# Patient Record
Sex: Male | Born: 1975
Health system: Southern US, Community
[De-identification: ages and names within clinical notes are randomized; demographics above are authoritative.]

## PROBLEM LIST (undated history)

## (undated) DIAGNOSIS — E7800 Pure hypercholesterolemia, unspecified: Secondary | ICD-10-CM

## (undated) HISTORY — PX: OTHER SURGICAL HISTORY: SHX169

---

## 2002-01-08 ENCOUNTER — Encounter: Payer: Self-pay | Admitting: Emergency Medicine

## 2002-01-08 ENCOUNTER — Emergency Department (HOSPITAL_COMMUNITY): Admission: EM | Admit: 2002-01-08 | Discharge: 2002-01-08 | Payer: Self-pay | Admitting: Emergency Medicine

## 2015-06-13 ENCOUNTER — Emergency Department (HOSPITAL_COMMUNITY): Payer: Self-pay

## 2015-06-13 ENCOUNTER — Emergency Department (HOSPITAL_COMMUNITY)
Admission: EM | Admit: 2015-06-13 | Discharge: 2015-06-13 | Disposition: A | Discharge status: Home / Self Care | Payer: Self-pay | Attending: Emergency Medicine | Admitting: Emergency Medicine

## 2015-06-13 ENCOUNTER — Encounter (HOSPITAL_COMMUNITY): Payer: Self-pay | Admitting: Emergency Medicine

## 2015-06-13 DIAGNOSIS — Y9364 Activity, baseball: Secondary | ICD-10-CM | POA: Insufficient documentation

## 2015-06-13 DIAGNOSIS — Y92320 Baseball field as the place of occurrence of the external cause: Secondary | ICD-10-CM | POA: Insufficient documentation

## 2015-06-13 DIAGNOSIS — S8992XA Unspecified injury of left lower leg, initial encounter: Secondary | ICD-10-CM | POA: Insufficient documentation

## 2015-06-13 DIAGNOSIS — Y998 Other external cause status: Secondary | ICD-10-CM | POA: Insufficient documentation

## 2015-06-13 DIAGNOSIS — X58XXXA Exposure to other specified factors, initial encounter: Secondary | ICD-10-CM | POA: Insufficient documentation

## 2015-06-13 MED ORDER — TRAMADOL HCL 50 MG PO TABS: 50.0000 mg | ORAL_TABLET | Freq: Four times a day (QID) | ORAL | Status: DC | PRN

## 2015-06-13 MED ORDER — HYDROCODONE-ACETAMINOPHEN 5-325 MG PO TABS
1.0000 | ORAL_TABLET | Freq: Once | ORAL | Status: AC
  Administered 2015-06-13: 1 via ORAL
  Filled 2015-06-13: qty 1

## 2015-06-13 MED ORDER — NAPROXEN 375 MG PO TABS: 375.0000 mg | ORAL_TABLET | Freq: Two times a day (BID) | ORAL | Status: DC

## 2015-06-13 NOTE — ED Provider Notes (Signed)
CSN: [id number]     Arrival date & time 06/13/15  1055 History   First MD Initiated Contact with Patient 06/13/15 1115     Chief Complaint  Patient presents with  . Knee Pain     (Consider location/radiation/quality/duration/timing/severity/associated sxs/prior Treatment) HPI   This is a 39 year old male who presents emergency Department with chief complaint of left knee injury. Patient has a past medical history of left anterior cruciate ligament repair about 20 years ago. Patient states he was playing baseball last night when his foot came down in a pothole in the ground. His foot was planted and he twisted medially reaching for a ball that he was catching. He felt his knee pop and had immediate pain but was able to ambulate off the field. Patient no longer bleeding again. Overnight he has had increasing swelling and states that this is "the worst pain of November had. He denies calf pain, foot swelling or tenderness. He is unable to fully extend the knee or ambulate at all. Patient brings his own crutches.  History reviewed. No pertinent past medical history. Past Surgical History  Procedure Laterality Date  . Knee surery     No family history on file. Social History  Substance Use Topics  . Smoking status: Never Smoker   . Smokeless tobacco: None  . Alcohol Use: Yes    Review of Systems  Constitutional: Negative for fever.  Musculoskeletal: Positive for joint swelling and gait problem.  Neurological: Negative for weakness and numbness.      Allergies  Review of patient's allergies indicates no known allergies.  Home Medications   Prior to Admission medications   Medication Sig Start Date End Date Taking? Authorizing Provider  PRESCRIPTION MEDICATION Inject into the skin daily. HCG injection for weightloss- from [facility]   Yes Historical Provider, MD   BP 123/68 mmHg  Pulse 63  Temp(Src) 98.2 F (36.8 C) (Oral)  Resp 16  SpO2 100% Physical Exam   Constitutional: He appears well-developed and well-nourished. No distress.  HENT:  Head: Normocephalic and atraumatic.  Eyes: Conjunctivae are normal. No scleral icterus.  Neck: Normal range of motion. Neck supple.  Cardiovascular: Normal rate, regular rhythm and normal heart sounds.   Pulmonary/Chest: Effort normal and breath sounds normal. No respiratory distress.  Abdominal: Soft. There is no tenderness.  Musculoskeletal: He exhibits no edema.  Left knee swelling. Patient unable to fully extend the knee due to swelling and pain. He is able to flex the knee to approximately 90. He has pain with medial stress. Ligaments appear stable. He is neurovascularly intact.  Neurological: He is alert.  Skin: Skin is warm and dry. He is not diaphoretic.  Psychiatric: His behavior is normal.  Nursing note and vitals reviewed.   ED Course  Procedures (including critical care time) Labs Review Labs Reviewed - No data to display  Imaging Review Dg Knee Complete 4 Views Left  06/13/2015  CLINICAL DATA:  Left knee pain, injury playing baseball yesterday EXAM: LEFT KNEE - COMPLETE 4+ VIEW COMPARISON:  None. FINDINGS: Four views of the left knee submitted. Postsurgical changes are noticed put ACL repair. No acute fracture or subluxation. Trace joint effusion. IMPRESSION: No acute fracture or subluxation. Postsurgical changes are noted post ACL repair. Trace joint effusion. Electronically Signed   By: Natasha MeadLiviu  Pop M.D.   On: 06/13/2015 11:53   I have personally reviewed and evaluated these images and lab results as part of my medical decision-making.   EKG Interpretation None  MDM   Final diagnoses:  None    Patient X-Ray negative for obvious fracture or dislocation. Pain managed in ED. Pt advised to follow up with orthopedics if symptoms persist for possibility of missed fracture diagnosis. Patient given brace while in ED, conservative therapy recommended and discussed. Patient will be dc  home & is agreeable with above plan.     Arthor Captain, PA-C 06/13/15 1248  Gilda Crease, MD 06/13/15 [phone]

## 2015-06-13 NOTE — ED Notes (Signed)
Per pt, states he came down on left knee wrong while playing baseball

## 2015-06-13 NOTE — Discharge Instructions (Signed)

## 2016-10-20 ENCOUNTER — Emergency Department (HOSPITAL_BASED_OUTPATIENT_CLINIC_OR_DEPARTMENT_OTHER): Payer: BLUE CROSS/BLUE SHIELD

## 2016-10-20 ENCOUNTER — Encounter (HOSPITAL_BASED_OUTPATIENT_CLINIC_OR_DEPARTMENT_OTHER): Payer: Self-pay | Admitting: *Deleted

## 2016-10-20 ENCOUNTER — Emergency Department (HOSPITAL_BASED_OUTPATIENT_CLINIC_OR_DEPARTMENT_OTHER)
Admission: EM | Admit: 2016-10-20 | Discharge: 2016-10-20 | Disposition: A | Discharge status: Home / Self Care | Payer: BLUE CROSS/BLUE SHIELD | Attending: Emergency Medicine | Admitting: Emergency Medicine

## 2016-10-20 ENCOUNTER — Emergency Department (HOSPITAL_COMMUNITY): Payer: BLUE CROSS/BLUE SHIELD

## 2016-10-20 DIAGNOSIS — Y999 Unspecified external cause status: Secondary | ICD-10-CM | POA: Diagnosis not present

## 2016-10-20 DIAGNOSIS — M50322 Other cervical disc degeneration at C5-C6 level: Secondary | ICD-10-CM | POA: Insufficient documentation

## 2016-10-20 DIAGNOSIS — Y93I9 Activity, other involving external motion: Secondary | ICD-10-CM | POA: Insufficient documentation

## 2016-10-20 DIAGNOSIS — R51 Headache: Secondary | ICD-10-CM | POA: Diagnosis not present

## 2016-10-20 DIAGNOSIS — Y92410 Unspecified street and highway as the place of occurrence of the external cause: Secondary | ICD-10-CM | POA: Insufficient documentation

## 2016-10-20 DIAGNOSIS — S199XXA Unspecified injury of neck, initial encounter: Secondary | ICD-10-CM | POA: Insufficient documentation

## 2016-10-20 DIAGNOSIS — M545 Low back pain: Secondary | ICD-10-CM | POA: Diagnosis not present

## 2016-10-20 DIAGNOSIS — S299XXA Unspecified injury of thorax, initial encounter: Secondary | ICD-10-CM | POA: Diagnosis not present

## 2016-10-20 DIAGNOSIS — R202 Paresthesia of skin: Secondary | ICD-10-CM | POA: Diagnosis not present

## 2016-10-20 DIAGNOSIS — M546 Pain in thoracic spine: Secondary | ICD-10-CM | POA: Diagnosis not present

## 2016-10-20 DIAGNOSIS — M542 Cervicalgia: Secondary | ICD-10-CM | POA: Diagnosis not present

## 2016-10-20 MED ORDER — HYDROCODONE-ACETAMINOPHEN 5-325 MG PO TABS
2.0000 | ORAL_TABLET | Freq: Once | ORAL | Status: AC
  Administered 2016-10-20: 2 via ORAL
  Filled 2016-10-20: qty 2

## 2016-10-20 MED ORDER — TRAMADOL HCL 50 MG PO TABS: 50.0000 mg | ORAL_TABLET | Freq: Four times a day (QID) | ORAL | 0 refills | Status: DC | PRN

## 2016-10-20 MED ORDER — NAPROXEN 500 MG PO TABS: 500.0000 mg | ORAL_TABLET | Freq: Two times a day (BID) | ORAL | 0 refills | Status: DC | PRN
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
## 2016-10-20 NOTE — ED Notes (Signed)
Patient transported to CT 

## 2016-10-20 NOTE — ED Notes (Signed)
Patient transported to MRI 
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
## 2016-10-20 NOTE — ED Notes (Signed)
Report to charge RN at [facility].

## 2016-11-18 DIAGNOSIS — M542 Cervicalgia: Secondary | ICD-10-CM | POA: Diagnosis not present

## 2016-11-30 DIAGNOSIS — R293 Abnormal posture: Secondary | ICD-10-CM | POA: Diagnosis not present

## 2016-11-30 DIAGNOSIS — M542 Cervicalgia: Secondary | ICD-10-CM | POA: Diagnosis not present

## 2016-11-30 DIAGNOSIS — R51 Headache: Secondary | ICD-10-CM | POA: Diagnosis not present

## 2016-11-30 DIAGNOSIS — M546 Pain in thoracic spine: Secondary | ICD-10-CM | POA: Diagnosis not present

## 2016-12-03 DIAGNOSIS — M546 Pain in thoracic spine: Secondary | ICD-10-CM | POA: Diagnosis not present

## 2016-12-03 DIAGNOSIS — R293 Abnormal posture: Secondary | ICD-10-CM | POA: Diagnosis not present

## 2016-12-03 DIAGNOSIS — M542 Cervicalgia: Secondary | ICD-10-CM | POA: Diagnosis not present

## 2016-12-03 DIAGNOSIS — R51 Headache: Secondary | ICD-10-CM | POA: Diagnosis not present

## 2017-01-04 DIAGNOSIS — R030 Elevated blood-pressure reading, without diagnosis of hypertension: Secondary | ICD-10-CM | POA: Diagnosis not present

## 2017-01-04 DIAGNOSIS — Z6832 Body mass index (BMI) 32.0-32.9, adult: Secondary | ICD-10-CM | POA: Diagnosis not present

## 2017-01-04 DIAGNOSIS — M542 Cervicalgia: Secondary | ICD-10-CM | POA: Diagnosis not present

## 2017-01-05 DIAGNOSIS — R197 Diarrhea, unspecified: Secondary | ICD-10-CM | POA: Diagnosis not present

## 2017-01-05 DIAGNOSIS — K529 Noninfective gastroenteritis and colitis, unspecified: Secondary | ICD-10-CM | POA: Diagnosis not present

## 2017-01-05 DIAGNOSIS — R112 Nausea with vomiting, unspecified: Secondary | ICD-10-CM | POA: Diagnosis not present

## 2017-01-05 DIAGNOSIS — R748 Abnormal levels of other serum enzymes: Secondary | ICD-10-CM | POA: Diagnosis not present

## 2017-01-05 DIAGNOSIS — E860 Dehydration: Secondary | ICD-10-CM | POA: Diagnosis not present

## 2017-01-05 DIAGNOSIS — R7989 Other specified abnormal findings of blood chemistry: Secondary | ICD-10-CM | POA: Diagnosis not present

## 2017-01-14 DIAGNOSIS — R7989 Other specified abnormal findings of blood chemistry: Secondary | ICD-10-CM | POA: Diagnosis not present

## 2017-01-14 DIAGNOSIS — R748 Abnormal levels of other serum enzymes: Secondary | ICD-10-CM | POA: Diagnosis not present

## 2017-01-28 DIAGNOSIS — R748 Abnormal levels of other serum enzymes: Secondary | ICD-10-CM | POA: Diagnosis not present

## 2018-01-05 ENCOUNTER — Encounter (HOSPITAL_BASED_OUTPATIENT_CLINIC_OR_DEPARTMENT_OTHER): Payer: Self-pay

## 2018-01-05 ENCOUNTER — Other Ambulatory Visit: Payer: Self-pay

## 2018-01-05 ENCOUNTER — Emergency Department (HOSPITAL_BASED_OUTPATIENT_CLINIC_OR_DEPARTMENT_OTHER): Payer: BLUE CROSS/BLUE SHIELD

## 2018-01-05 DIAGNOSIS — S91311A Laceration without foreign body, right foot, initial encounter: Secondary | ICD-10-CM | POA: Insufficient documentation

## 2018-01-05 DIAGNOSIS — W25XXXA Contact with sharp glass, initial encounter: Secondary | ICD-10-CM | POA: Insufficient documentation

## 2018-01-05 DIAGNOSIS — Y9389 Activity, other specified: Secondary | ICD-10-CM | POA: Diagnosis not present

## 2018-01-05 DIAGNOSIS — Z79899 Other long term (current) drug therapy: Secondary | ICD-10-CM | POA: Diagnosis not present

## 2018-01-05 DIAGNOSIS — Y929 Unspecified place or not applicable: Secondary | ICD-10-CM | POA: Insufficient documentation

## 2018-01-05 DIAGNOSIS — S99921A Unspecified injury of right foot, initial encounter: Secondary | ICD-10-CM | POA: Diagnosis not present

## 2018-01-05 DIAGNOSIS — Z23 Encounter for immunization: Secondary | ICD-10-CM | POA: Diagnosis not present

## 2018-01-05 DIAGNOSIS — Y998 Other external cause status: Secondary | ICD-10-CM | POA: Diagnosis not present

## 2018-01-05 NOTE — ED Triage Notes (Signed)
Pt stepped on broken glass approx 30 min PTA-right foot lac-NAD-presents to triage in w/c

## 2018-01-06 ENCOUNTER — Emergency Department (HOSPITAL_BASED_OUTPATIENT_CLINIC_OR_DEPARTMENT_OTHER)
Admission: EM | Admit: 2018-01-06 | Discharge: 2018-01-06 | Disposition: A | Discharge status: Home / Self Care | Payer: BLUE CROSS/BLUE SHIELD | Attending: Emergency Medicine | Admitting: Emergency Medicine

## 2018-01-06 DIAGNOSIS — S91311A Laceration without foreign body, right foot, initial encounter: Secondary | ICD-10-CM

## 2018-01-06 MED ORDER — IBUPROFEN 800 MG PO TABS
ORAL_TABLET | ORAL | Status: AC
  Administered 2018-01-06: 800 mg
  Filled 2018-01-06: qty 1

## 2018-01-06 MED ORDER — TETANUS-DIPHTH-ACELL PERTUSSIS 5-2.5-18.5 LF-MCG/0.5 IM SUSP
INTRAMUSCULAR | Status: AC
  Administered 2018-01-06: 0.5 mL via INTRAMUSCULAR
  Filled 2018-01-06: qty 0.5

## 2018-01-06 NOTE — ED Notes (Signed)
Pieces of glass found on bottom of foot while cleaning lac. Small abrasions noted. Pt foot soaking in iodine/sterile water.

## 2018-01-06 NOTE — Discharge Instructions (Addendum)
You may alternate Tylenol 1000 mg every 6 hours as needed for pain and Ibuprofen 800 mg every 8 hours as needed for pain.  Please take Ibuprofen with food.   Do not apply Neosporin or bacitracin to your foot wound as this will breakdown Dermabond.   To find a primary care or specialty doctor please call [phone] or [phone] to access "[facility] Find a Doctor Service."  You may also go on the [facility] website at [email or web address]  There are also multiple [facility], [facility], [facility] and [facility] practices throughout the [place] that are frequently accepting new patients. You may find a clinic that is close to your home and contact them.  [facility] -  [address] [place] [address] [phone]   [facility] Department -  [address] [place] [address] [phone]   [facility] Department [phone]  [place] [address] [phone]

## 2018-01-06 NOTE — ED Notes (Signed)
Pt foot/lac removed from soaking and cleansed with sterile water.

## 2018-01-06 NOTE — ED Provider Notes (Signed)
TIME SEEN: 12:54 AM  CHIEF COMPLAINT: Right foot laceration  HPI: Patient is a 42 year old male who presents to the emergency department with a right foot laceration.  States that he stepped on a piece of glass just prior to arrival.  No other injury.  Unsure of his last tetanus vaccination.  States he is barefoot when this happened.  ROS: See HPI Constitutional: no fever  Eyes: no drainage  ENT: no runny nose   Cardiovascular:  no chest pain  Resp: no SOB  GI: no vomiting GU: no dysuria Integumentary: no rash  Allergy: no hives  Musculoskeletal: no leg swelling  Neurological: no slurred speech ROS otherwise negative  PAST MEDICAL HISTORY/PAST SURGICAL HISTORY:  History reviewed. No pertinent past medical history.  MEDICATIONS:  Prior to Admission medications   Medication Sig Start Date End Date Taking? Authorizing Provider  docusate sodium (COLACE) 100 MG capsule Take 100 mg by mouth daily as needed for mild constipation.    [provider]  naproxen (NAPROSYN) 500 MG tablet Take 1 tablet (500 mg total) by mouth 2 (two) times daily as needed. 10/20/16   Raeford Razor, MD  Omega-3 Fatty Acids (FISH OIL) 1000 MG CAPS Take 3,000 mg by mouth daily.    [provider]  traMADol (ULTRAM) 50 MG tablet Take 1 tablet (50 mg total) by mouth every 6 (six) hours as needed. 10/20/16   Raeford Razor, MD    ALLERGIES:  No Known Allergies  SOCIAL HISTORY:  Social History   Tobacco Use  . Smoking status: Never Smoker  . Smokeless tobacco: Never Used  Substance Use Topics  . Alcohol use: Yes    Comment: occ    FAMILY HISTORY: No family history on file.  EXAM: BP 118/77 (BP Location: Right Arm)   Pulse 87   Temp 98.1 F (36.7 C) (Oral)   Resp 18   Ht  (1.753 m)   Wt 99.8 kg (220 lb)   SpO2 97%   BMI 32.49 kg/m  CONSTITUTIONAL: Alert and oriented and responds appropriately to questions. Well-appearing; well-nourished HEAD: Normocephalic EYES:  Conjunctivae clear, pupils appear equal, EOMI ENT: normal nose; moist mucous membranes NECK: Supple, no meningismus, no nuchal rigidity, no LAD  CARD: RRR; S1 and S2 appreciated; no murmurs, no clicks, no rubs, no gallops RESP: Normal chest excursion without splinting or tachypnea; breath sounds clear and equal bilaterally; no wheezes, no rhonchi, no rales, no hypoxia or respiratory distress, speaking full sentences ABD/GI: Normal bowel sounds; non-distended; soft, non-tender, no rebound, no guarding, no peritoneal signs, no hepatosplenomegaly BACK:  The back appears normal and is non-tender to palpation, there is no CVA tenderness EXT: He has a very superficial approximately 3 cm wound to the lateral sole of the right foot.  No obvious foreign body appreciated.  Wound is well approximated and not bleeding.  2+ DP pulses bilaterally.  Normal ROM in all joints; no bony deformity on examination and otherwise extremities are non-tender to palpation; no edema; normal capillary refill; no cyanosis, no calf tenderness or swelling    SKIN: Normal color for age and race; warm; no rash NEURO: Moves all extremities equally PSYCH: The patient's mood and manner are appropriate. Grooming and personal hygiene are appropriate.  MEDICAL DECISION MAKING: Patient here with right foot laceration.  It does not need sutures as it is well approximated and superficial.  His wife is requesting something to close the wound and we have discussed Dermabond.  The wound has been copiously  irrigated and soaked in sterile water with Betadine.  There is no appreciated foreign body on examination or x-ray.  No bony injury.  Tetanus vaccination has been updated.  We have placed him in a hard sole shoe with sterile dressing and provided him with crutches for comfort.  Will give work note as he does stay on his feet for a large amount of the day.  We discussed return precautions.  I do not feel he needs prophylactic antibiotics.  Patient  and wife comfortable with this plan.   At this time, I do not feel there is any life-threatening condition present. I have reviewed and discussed all results (EKG, imaging, lab, urine as appropriate) and exam findings with patient/family. I have reviewed nursing notes and appropriate previous records.  I feel the patient is safe to be discharged home without further emergent workup and can continue workup as an outpatient as needed. Discussed usual and customary return precautions. Patient/family verbalize understanding and are comfortable with this plan.  Outpatient follow-up has been provided if needed. All questions have been answered.   Marland Kitchen.Laceration Repair Performed by: Sukhdeep Wieting, Layla Maw, DO Authorized by: Laportia Carley, Layla Maw, DO   Consent:    Consent obtained:  Verbal   Consent given by:  Patient   Risks discussed:  Infection, pain, retained foreign body and poor wound healing   Alternatives discussed:  No treatment Anesthesia (see MAR for exact dosages):    Anesthesia method:  None Laceration details:    Location:  Foot   Foot location:  Sole of R foot   Length (cm):  3   Depth (mm):  2 Repair type:    Repair type:  Simple Pre-procedure details:    Preparation:  Patient was prepped and draped in usual sterile fashion and imaging obtained to evaluate for foreign bodies Exploration:    Wound exploration: wound explored through full range of motion     Contaminated: no   Treatment:    Area cleansed with:  Betadine and saline   Amount of cleaning:  Extensive   Irrigation solution:  Sterile saline Skin repair:    Repair method:  Tissue adhesive Approximation:    Approximation:  Close Post-procedure details:    Dressing:  Sterile dressing   Patient tolerance of procedure:  Tolerated well, no immediate complications        Georgia Delsignore, Layla Maw, DO 01/06/18 0530

## 2018-01-10 ENCOUNTER — Emergency Department (HOSPITAL_BASED_OUTPATIENT_CLINIC_OR_DEPARTMENT_OTHER)
Admission: EM | Admit: 2018-01-10 | Discharge: 2018-01-10 | Disposition: A | Discharge status: Home / Self Care | Payer: BLUE CROSS/BLUE SHIELD | Attending: Emergency Medicine | Admitting: Emergency Medicine

## 2018-01-10 ENCOUNTER — Other Ambulatory Visit: Payer: Self-pay

## 2018-01-10 ENCOUNTER — Encounter (HOSPITAL_BASED_OUTPATIENT_CLINIC_OR_DEPARTMENT_OTHER): Payer: Self-pay | Admitting: *Deleted

## 2018-01-10 DIAGNOSIS — X58XXXA Exposure to other specified factors, initial encounter: Secondary | ICD-10-CM | POA: Insufficient documentation

## 2018-01-10 DIAGNOSIS — Y929 Unspecified place or not applicable: Secondary | ICD-10-CM | POA: Insufficient documentation

## 2018-01-10 DIAGNOSIS — Z79899 Other long term (current) drug therapy: Secondary | ICD-10-CM | POA: Diagnosis not present

## 2018-01-10 DIAGNOSIS — S91311D Laceration without foreign body, right foot, subsequent encounter: Secondary | ICD-10-CM | POA: Insufficient documentation

## 2018-01-10 DIAGNOSIS — Y999 Unspecified external cause status: Secondary | ICD-10-CM | POA: Diagnosis not present

## 2018-01-10 DIAGNOSIS — Z5189 Encounter for other specified aftercare: Secondary | ICD-10-CM

## 2018-01-10 DIAGNOSIS — Y939 Activity, unspecified: Secondary | ICD-10-CM | POA: Diagnosis not present

## 2018-01-10 NOTE — Discharge Instructions (Signed)
The appearance of the wound today is reassuring.  No noted evidence of infection.  May follow-up with your primary care provider for any further management of this issue. Return for fever, significantly increased pain, redness, swelling, or any other major concerns.

## 2018-01-10 NOTE — ED Notes (Signed)
ED Provider at bedside. 

## 2018-01-10 NOTE — ED Provider Notes (Signed)
[facility] EMERGENCY DEPARTMENT Provider Note   CSN: [id number] Arrival date & time: 01/10/18  1141     History   Chief Complaint Chief Complaint  Patient presents with  . Follow-up    HPI Gregory Stafford is a 42 y.o. male.  HPI   Gregory Stafford is a 42 y.o. male, patient with no pertinent past medical history, presenting to the ED for a wound check.  Patient had a laceration to the sole of the right foot repaired with Dermabond on May 9.  States he took the bandage off and the glue came with it today.  Denies fever, redness, increased pain, swelling, or any other complaints.    History reviewed. No pertinent past medical history.  There are no active problems to display for this patient.   Past Surgical History:  Procedure Laterality Date  . knee surery          Home Medications    Prior to Admission medications   Medication Sig Start Date End Date Taking? Authorizing Provider  docusate sodium (COLACE) 100 MG capsule Take 100 mg by mouth daily as needed for mild constipation.    [provider]  naproxen (NAPROSYN) 500 MG tablet Take 1 tablet (500 mg total) by mouth 2 (two) times daily as needed. 10/20/16   Raeford Razor, MD  Omega-3 Fatty Acids (FISH OIL) 1000 MG CAPS Take 3,000 mg by mouth daily.    [provider]  traMADol (ULTRAM) 50 MG tablet Take 1 tablet (50 mg total) by mouth every 6 (six) hours as needed. 10/20/16   Raeford Razor, MD    Family History History reviewed. No pertinent family history.  Social History Social History   Tobacco Use  . Smoking status: Never Smoker  . Smokeless tobacco: Never Used  Substance Use Topics  . Alcohol use: Yes    Comment: occ  . Drug use: Never     Allergies   Patient has no known allergies.   Review of Systems Review of Systems  Constitutional: Negative for fever.  Skin: Positive for wound.  Neurological: Negative for numbness.     Physical Exam Updated Vital  Signs BP 119/72 (BP Location: Right Arm)   Pulse 86   Temp 98.2 F (36.8 C) (Oral)   Resp 18   SpO2 100%   Physical Exam  Constitutional: He appears well-developed and well-nourished. No distress.  HENT:  Head: Normocephalic and atraumatic.  Eyes: Conjunctivae are normal.  Neck: Neck supple.  Cardiovascular: Normal rate, regular rhythm and intact distal pulses.  Pulmonary/Chest: Effort normal.  Musculoskeletal:  Laceration to the sole of the right foot.  Wound edges approximate spontaneously.  Internal area of the wound appears well-healed with no evidence of dehiscence.  There is an area of superficial skin that does appear to be loose, however, patient has no area of erythema, and appropriate tenderness, swelling, or other noted abnormality.  Neurological: He is alert.  Skin: Skin is warm and dry. He is not diaphoretic. No erythema. No pallor.  Psychiatric: He has a normal mood and affect. His behavior is normal.  Nursing note and vitals reviewed.         ED Treatments / Results  Labs (all labs ordered are listed, but only abnormal results are displayed) Labs Reviewed - No data to display  EKG None  Radiology No results found.  Procedures Procedures (including critical care time)  Medications Ordered in ED Medications - No data to display  Initial Impression / Assessment and Plan / ED Course  I have reviewed the triage vital signs and the nursing notes.  Pertinent labs & imaging results that were available during my care of the patient were reviewed by me and considered in my medical decision making (see chart for details).     Patient presents for a wound check.  It appears to be healing well.  No noted evidence of infection at this time.  PCP follow-up for any further management. The patient was given instructions for home care as well as return precautions. Patient voices understanding of these instructions, accepts the plan, and is comfortable with  discharge.  Final Clinical Impressions(s) / ED Diagnoses   Final diagnoses:  Visit for wound check    ED Discharge Orders    None       Concepcion Living 01/10/18 1421    Raeford Razor, MD 01/12/18 1218

## 2018-01-10 NOTE — ED Triage Notes (Signed)
Pt amb to triage with quick steady gait reporting was seen here for lac on bottom of his foot, states "she glued it, but now the glue has come open." dsd in place at triage.

## 2018-04-19 DIAGNOSIS — R112 Nausea with vomiting, unspecified: Secondary | ICD-10-CM | POA: Diagnosis not present

## 2018-04-19 DIAGNOSIS — K529 Noninfective gastroenteritis and colitis, unspecified: Secondary | ICD-10-CM | POA: Diagnosis not present

## 2018-04-19 DIAGNOSIS — R197 Diarrhea, unspecified: Secondary | ICD-10-CM | POA: Diagnosis not present

## 2018-12-05 DIAGNOSIS — M79672 Pain in left foot: Secondary | ICD-10-CM | POA: Diagnosis not present

## 2019-01-20 DIAGNOSIS — M79672 Pain in left foot: Secondary | ICD-10-CM | POA: Diagnosis not present

## 2019-05-11 DIAGNOSIS — Z125 Encounter for screening for malignant neoplasm of prostate: Secondary | ICD-10-CM | POA: Diagnosis not present

## 2019-05-11 DIAGNOSIS — Z13220 Encounter for screening for lipoid disorders: Secondary | ICD-10-CM | POA: Diagnosis not present

## 2019-05-11 DIAGNOSIS — Z23 Encounter for immunization: Secondary | ICD-10-CM | POA: Diagnosis not present

## 2019-05-11 DIAGNOSIS — Z0000 Encounter for general adult medical examination without abnormal findings: Secondary | ICD-10-CM | POA: Diagnosis not present

## 2019-07-14 IMAGING — CR DG FOOT COMPLETE 3+V*R*
3 series · 3 of 3 positions shown · non-contrast
Comparison: None.

CLINICAL DATA: 41-year-old male with injury to the right foot.
Patient stepped on broken glass.

EXAM:
RIGHT FOOT COMPLETE - 3+ VIEW

[t foot ap right]
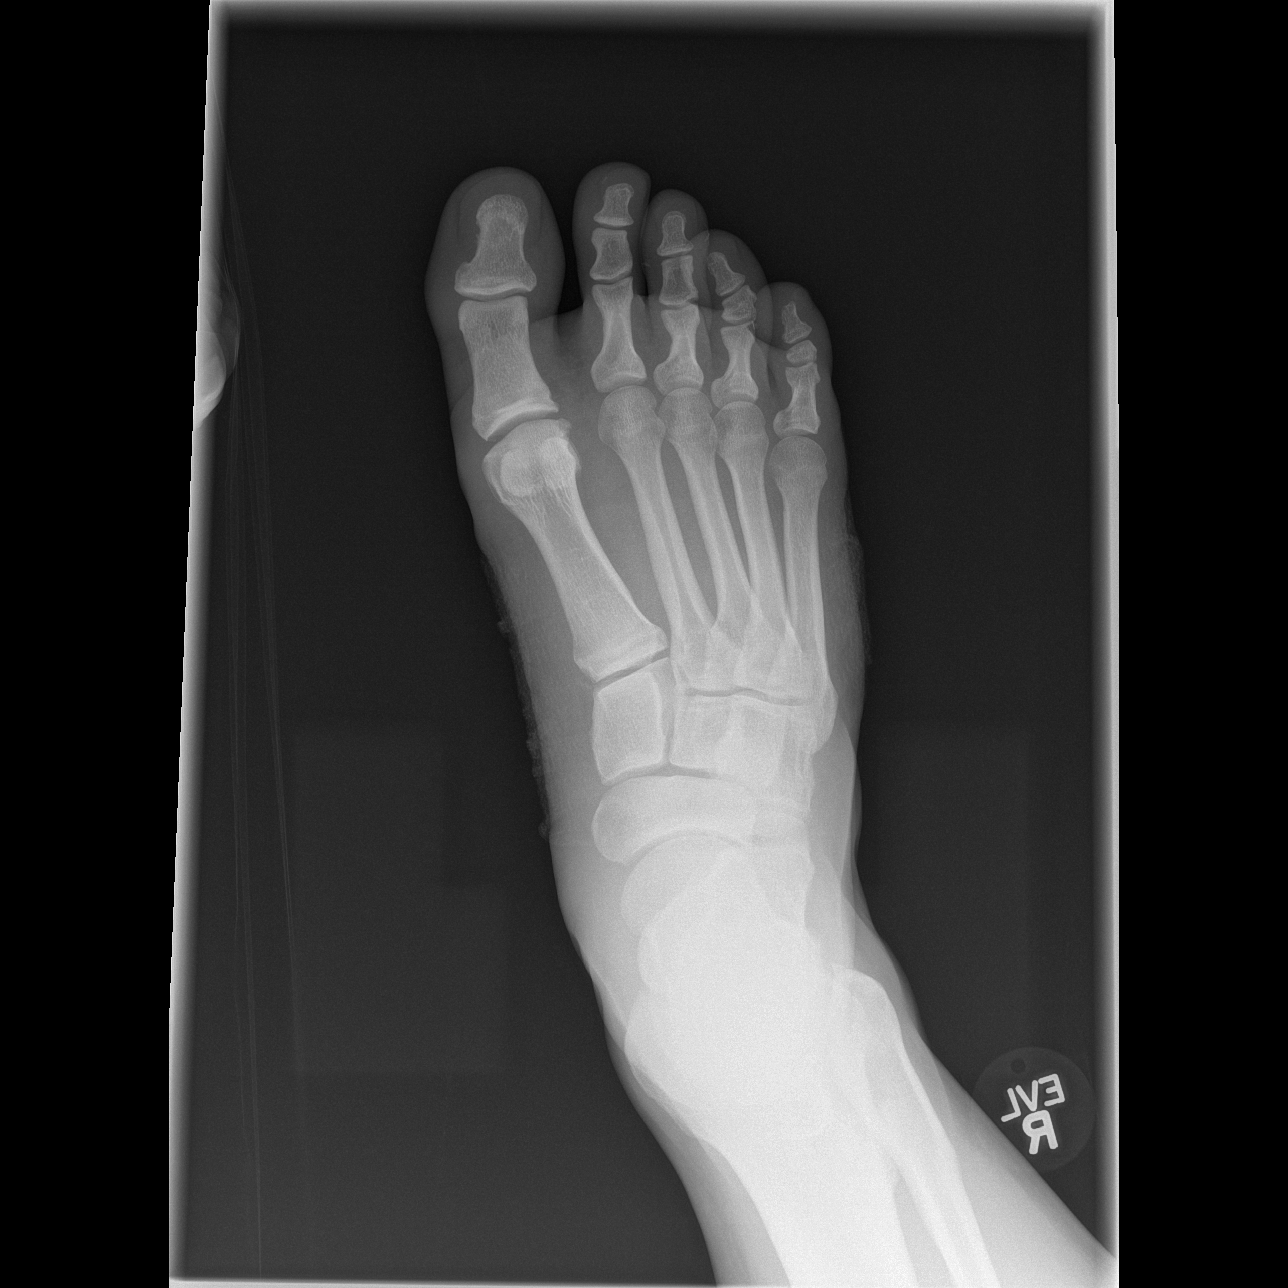

[t foot oblique right]
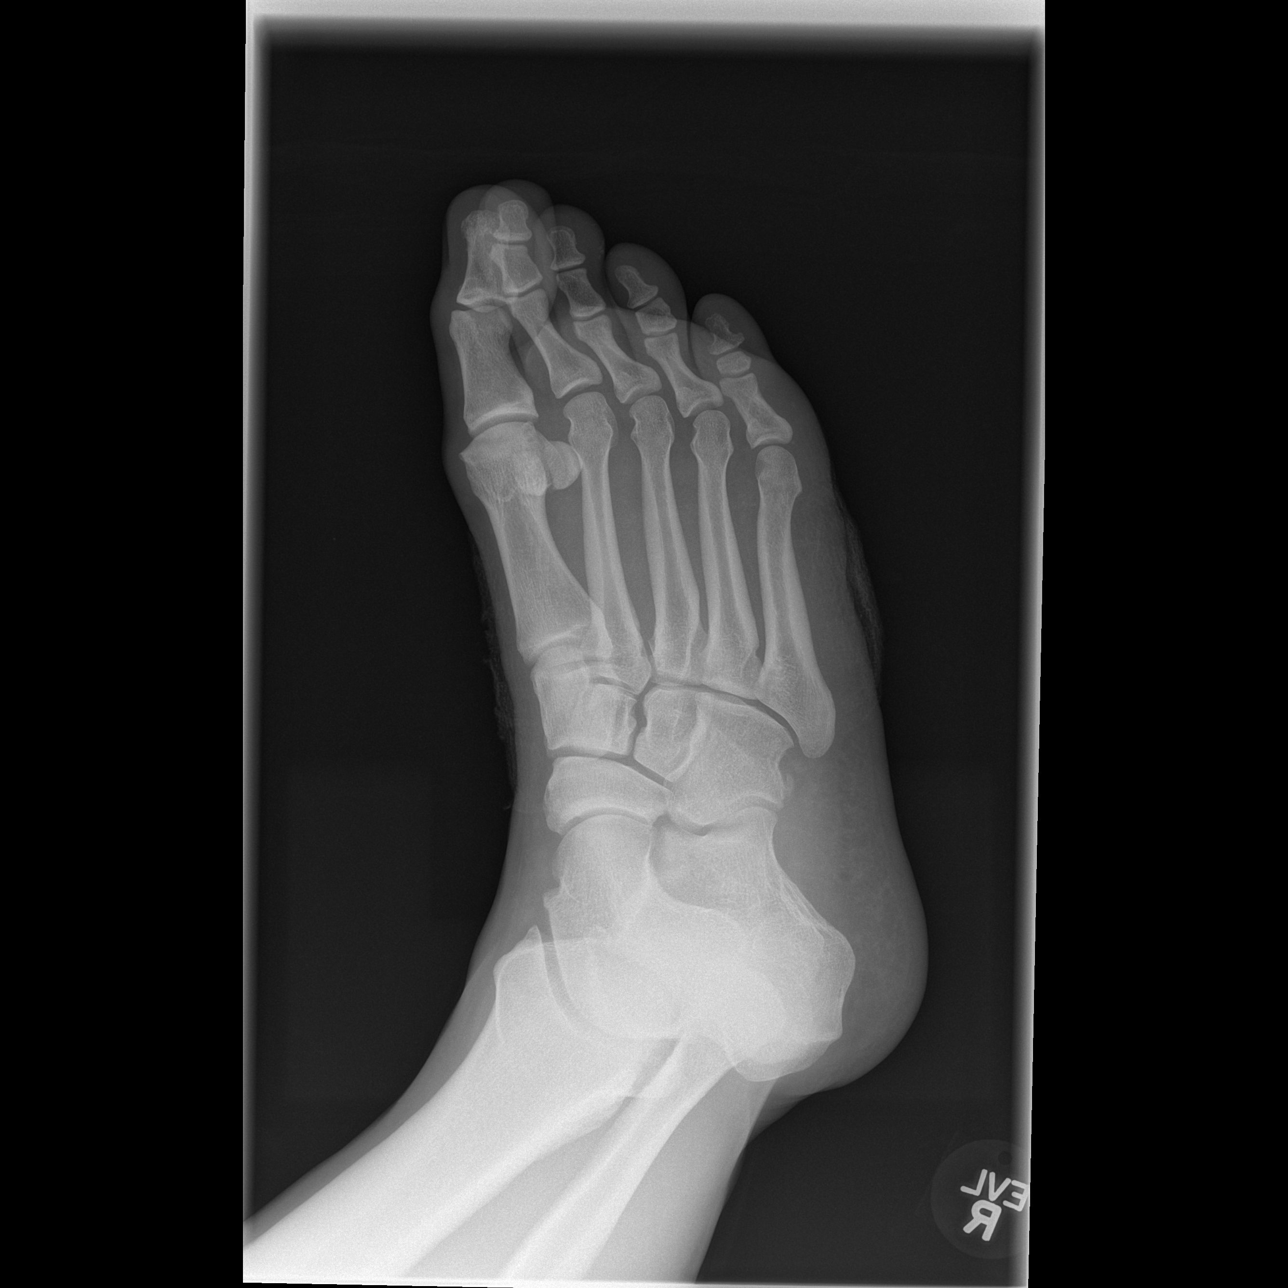

[t foot lat right]
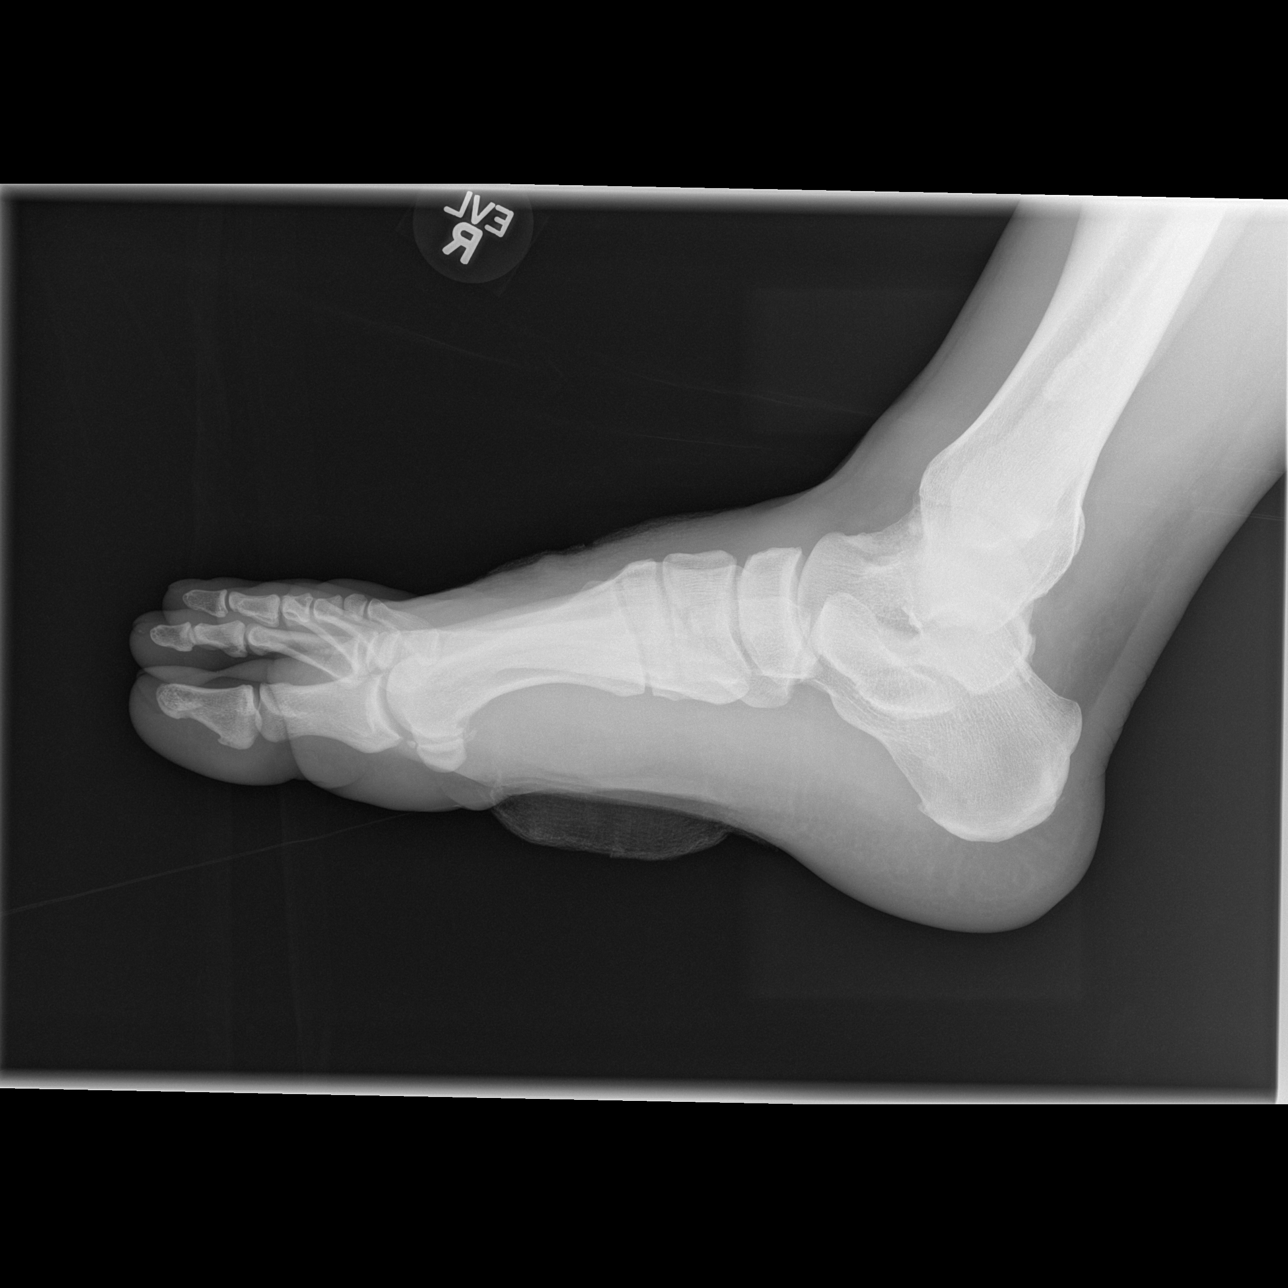

[3 of 3 positions shown; findings below may reference images not displayed]

FINDINGS: There is no acute fracture or dislocation. No arthritic changes. The
soft tissues are unremarkable. No radiopaque foreign object or soft
tissue gas. A dressing is noted over the midfoot.
IMPRESSION: Negative.

## 2019-07-31 ENCOUNTER — Emergency Department (HOSPITAL_BASED_OUTPATIENT_CLINIC_OR_DEPARTMENT_OTHER): Payer: BC Managed Care – PPO

## 2019-07-31 ENCOUNTER — Other Ambulatory Visit: Payer: Self-pay

## 2019-07-31 ENCOUNTER — Encounter (HOSPITAL_BASED_OUTPATIENT_CLINIC_OR_DEPARTMENT_OTHER): Payer: Self-pay | Admitting: *Deleted

## 2019-07-31 ENCOUNTER — Emergency Department (HOSPITAL_BASED_OUTPATIENT_CLINIC_OR_DEPARTMENT_OTHER)
Admission: EM | Admit: 2019-07-31 | Discharge: 2019-07-31 | Disposition: A | Discharge status: Home / Self Care | Payer: BC Managed Care – PPO | Attending: Emergency Medicine | Admitting: Emergency Medicine

## 2019-07-31 DIAGNOSIS — R300 Dysuria: Secondary | ICD-10-CM | POA: Insufficient documentation

## 2019-07-31 DIAGNOSIS — R1031 Right lower quadrant pain: Secondary | ICD-10-CM | POA: Diagnosis not present

## 2019-07-31 DIAGNOSIS — R1030 Lower abdominal pain, unspecified: Secondary | ICD-10-CM | POA: Diagnosis not present

## 2019-07-31 DIAGNOSIS — R1032 Left lower quadrant pain: Secondary | ICD-10-CM | POA: Diagnosis not present

## 2019-07-31 HISTORY — DX: Pure hypercholesterolemia, unspecified: E78.00

## 2019-07-31 LAB — CBC WITH DIFFERENTIAL/PLATELET
Abs Immature Granulocytes: 0.01 10*3/uL (ref 0.00–0.07)
Basophils Absolute: 0.1 10*3/uL (ref 0.0–0.1)
Basophils Relative: 1 %
Eosinophils Absolute: 0.3 10*3/uL (ref 0.0–0.5)
Eosinophils Relative: 6 %
HCT: 43.3 % (ref 39.0–52.0)
Hemoglobin: 15 g/dL (ref 13.0–17.0)
Immature Granulocytes: 0 %
Lymphocytes Relative: 34 %
Lymphs Abs: 1.9 10*3/uL (ref 0.7–4.0)
MCH: 31.6 pg (ref 26.0–34.0)
MCHC: 34.6 g/dL (ref 30.0–36.0)
MCV: 91.2 fL (ref 80.0–100.0)
Monocytes Absolute: 0.4 10*3/uL (ref 0.1–1.0)
Monocytes Relative: 8 %
Neutro Abs: 2.9 10*3/uL (ref 1.7–7.7)
Neutrophils Relative %: 51 %
Platelets: 190 10*3/uL (ref 150–400)
RBC: 4.75 MIL/uL (ref 4.22–5.81)
RDW: 12.4 % (ref 11.5–15.5)
WBC: 5.6 10*3/uL (ref 4.0–10.5)
nRBC: 0 % (ref 0.0–0.2)

## 2019-07-31 LAB — BASIC METABOLIC PANEL
Anion gap: 5 (ref 5–15)
BUN: 15 mg/dL (ref 6–20)
CO2: 23 mmol/L (ref 22–32)
Calcium: 8.7 mg/dL — ABNORMAL LOW (ref 8.9–10.3)
Chloride: 105 mmol/L (ref 98–111)
Creatinine, Ser: 0.95 mg/dL (ref 0.61–1.24)
GFR calc Af Amer: >60 mL/min (ref >60)
GFR calc non Af Amer: >60 mL/min (ref >60)
Glucose, Bld: 99 mg/dL (ref 70–99)
Potassium: 3.7 mmol/L (ref 3.5–5.1)
Sodium: 133 mmol/L — ABNORMAL LOW (ref 135–145)

## 2019-07-31 LAB — URINALYSIS, ROUTINE W REFLEX MICROSCOPIC
Bilirubin Urine: NEGATIVE
Glucose, UA: NEGATIVE mg/dL
Hgb urine dipstick: NEGATIVE
Ketones, ur: NEGATIVE mg/dL
Leukocytes,Ua: NEGATIVE
Nitrite: NEGATIVE
Protein, ur: NEGATIVE mg/dL
Specific Gravity, Urine: 1.02 (ref 1.005–1.030)
pH: 5.5 (ref 5.0–8.0)

## 2019-07-31 MED ORDER — KETOROLAC TROMETHAMINE 15 MG/ML IJ SOLN
15.0000 mg | Freq: Once | INTRAMUSCULAR | Status: AC
  Administered 2019-07-31: 15 mg via INTRAVENOUS
  Filled 2019-07-31: qty 1

## 2019-07-31 MED ORDER — TRAMADOL HCL 50 MG PO TABS: 50.0000 mg | ORAL_TABLET | Freq: Four times a day (QID) | ORAL | 0 refills | Status: AC | PRN

## 2019-07-31 NOTE — ED Notes (Signed)
ED Provider at bedside. 

## 2019-07-31 NOTE — ED Notes (Addendum)
ED Provider at bedside discussing test results and dispo plan of care. 

## 2019-07-31 NOTE — Discharge Instructions (Signed)
Your work-up has been reassuring in the emergency department today.  CAT scan shows some chronic low back changes but no other acute findings in your abdomen or pelvis.  Ultrasound of your testicles showed no signs of twisting or swelling.  UA does not show any concern for infection.  Labs are very reassuring.  Unknown cause of your symptoms.  May be a pulled muscle from you working yesterday.  I would recommend taking anti-inflammatories including Motrin ibuprofen at home along with Tylenol.  Have given you a short course of stronger pain medication to take if needed.  Use scrotal supports to help elevate your testicles.  Intermittent ice application help with any swelling.  If your pain does not improve in the next 2 to 3 days return to the ER.  Follow-up with urology.

## 2019-07-31 NOTE — ED Triage Notes (Signed)
Bilateral groin pain started yesterday after pulling trees.

## 2019-07-31 NOTE — ED Provider Notes (Signed)
Home EMERGENCY DEPARTMENT Provider Note   CSN: [id number] Arrival date & time: 07/31/19  9485     History   Chief Complaint Chief Complaint  Patient presents with   Groin pain    HPI Gregory Stafford is a 43 y.o. male.     HPI 43 year old Caucasian male with no pertinent past medical history presents to the emergency department today for evaluation of groin pain.  Patient reports that he was working on trees yesterday when he felt some pain to his lower groin.  States the pain has persisted.  Has taken no medications for the pain prior to arrival.  Reports the pain is in his bilateral testicles.  He states he does have some burning when he urinates.  Denies any testicular swelling.  Denies any associated fever, chills, nausea, vomiting, change in bowel habits, hematuria, penile discharge.  He is sexually active with one partner but has no concern for STD.  Denies any abdominal pain.  Pain is worse with ambulation and palpation.  Concerned that it may be a hernia.  Patient does report having small amount of pain several months ago but this quickly resolved.  States he does a lot of heavy lifting for work. Past Medical History:  Diagnosis Date   Hypercholesterolemia     There are no active problems to display for this patient.   Past Surgical History:  Procedure Laterality Date   knee surery          Home Medications    Prior to Admission medications   Medication Sig Start Date End Date Taking? Authorizing Provider  atorvastatin (LIPITOR) 10 MG tablet Take 10 mg by mouth daily.   Yes [provider]  Omega-3 Fatty Acids (FISH OIL) 1000 MG CAPS Take 3,000 mg by mouth daily.    [provider]    Family History Family History  Problem Relation Age of Onset   Heart attack Father     Social History Social History   Tobacco Use   Smoking status: Never Smoker   Smokeless tobacco: Never Used  Substance Use Topics   Alcohol  use: Yes    Comment: occ   Drug use: Never     Allergies   Patient has no known allergies.   Review of Systems Review of Systems  Constitutional: Negative for chills and fever.  HENT: Negative for congestion.   Eyes: Negative for discharge.  Respiratory: Negative for shortness of breath.   Cardiovascular: Negative for chest pain.  Gastrointestinal: Negative for abdominal pain, diarrhea, nausea and vomiting.  Genitourinary: Positive for dysuria and testicular pain. Negative for decreased urine volume, difficulty urinating, discharge, frequency, hematuria, penile pain and scrotal swelling.  Musculoskeletal: Negative for myalgias.  Skin: Negative for color change.  Neurological: Negative for headaches.  Psychiatric/Behavioral: Negative for confusion.     Physical Exam Updated Vital Signs Ht 5\' 8"  (1.727 m)    Wt 114.3 kg    BMI 38.32 kg/m   Physical Exam Vitals signs and nursing note reviewed. Exam conducted with a chaperone present.  Constitutional:      General: He is not in acute distress.    Appearance: He is well-developed. He is not ill-appearing or toxic-appearing.  HENT:     Head: Normocephalic and atraumatic.  Eyes:     General: No scleral icterus.       Right eye: No discharge.        Left eye: No discharge.  Neck:  Musculoskeletal: Normal range of motion.  Pulmonary:     Effort: No respiratory distress.  Abdominal:     General: Abdomen is flat. Bowel sounds are normal. There is no distension.     Palpations: Abdomen is soft. There is no mass.     Tenderness: There is no abdominal tenderness. There is no right CVA tenderness, left CVA tenderness, guarding or rebound.     Hernia: No hernia is present.  Genitourinary:    Comments: Chaperone present for exam. Circumcised male. No penile discharge, erythema, tenderness, lesion, or rash. 2 descended testes without swelling, lesions or rash.  Have mild pain to palpation of bilateral testicles.  No inguinal  lymphadenopathy or hernia.   Musculoskeletal: Normal range of motion.  Skin:    General: Skin is warm and dry.     Capillary Refill: Capillary refill takes less than 2 seconds.     Coloration: Skin is not pale.  Neurological:     Mental Status: He is alert.  Psychiatric:        Behavior: Behavior normal.        Thought Content: Thought content normal.        Judgment: Judgment normal.      ED Treatments / Results  Labs (all labs ordered are listed, but only abnormal results are displayed) Labs Reviewed  BASIC METABOLIC PANEL - Abnormal; Notable for the following components:      Result Value   Sodium 133 (*)    Calcium 8.7 (*)    All other components within normal limits  CBC WITH DIFFERENTIAL/PLATELET  URINALYSIS, ROUTINE W REFLEX MICROSCOPIC  GC/CHLAMYDIA PROBE AMP ([place]) NOT AT [facility]    EKG None  Radiology [place] Scrotum W/doppler  Result Date: 07/31/2019 CLINICAL DATA:  Bilateral groin pain EXAM: SCROTAL ULTRASOUND DOPPLER ULTRASOUND OF THE TESTICLES TECHNIQUE: Complete ultrasound examination of the testicles, epididymis, and other scrotal structures was performed. Color and spectral Doppler ultrasound were also utilized to evaluate blood flow to the testicles. COMPARISON:  None. FINDINGS: Right testicle Measurements: 4.5 x 2.4 x 3 cm. No mass or microlithiasis visualized. Left testicle Measurements: 4.3 x 2.4 x 2.8 cm. No mass or microlithiasis visualized. Right epididymis:  Epididymal head cyst measuring 0.7 cm. Left epididymis:  Epididymal head cyst measuring 1.1 cm Hydrocele:  None visualized. Varicocele:  None visualized. Pulsed Doppler interrogation of both testes demonstrates normal low resistance arterial and venous waveforms bilaterally. IMPRESSION: No significant abnormality. Electronically Signed   By: Guadlupe SpanishPraneil  Patel M.D.   On: 07/31/2019 10:43    Procedures Procedures (including critical care time)  Medications Ordered in ED Medications - No data to  display   Initial Impression / Assessment and Plan / ED Course  I have reviewed the triage vital signs and the nursing notes.  Pertinent labs & imaging results that were available during my care of the patient were reviewed by me and considered in my medical decision making (see chart for details).        43 year old male presents the ER for evaluation of bilateral groin and testicle pain.  Differential diagnosis includes hernia, epididymitis, nephrolithiasis, UTI.  Low suspicion for testicular torsion.  Will obtain basic lab work, UA and ultrasound this time.  Exam is reassuring.  No significant swelling.  Mild tenderness palpation of the bilateral testicles.  No focal abdominal tenderness.  No CVA tenderness.  Labs are very reassuring.  No leukocytosis.  Normal hemoglobin.  No significant electrolyte derangement.  UA shows no signs of  infection.  GC and Chlamydia cultures are pending however have low suspicion for STD as patient declines any concern.  Initial testicular ultrasound was performed that shows no evidence of epididymitis or testicular torsion.  Proceed with CT scan to rule out kidney stone that could be causing patient's testicular pain or dysuria.  CT scan although was not resulted in the system did obtain a paper copy that shows no acute findings to explain patient's symptoms.  Does have chronic bilateral L4 pars intra-articular fractures with associated grade 1 anterior listhesis and moderate by foraminal narrowing at L4-L5 this is chronic in nature.  Patient denies any associated back pain at this time.  Not likely causing patient's symptoms.  Unknown etiology of patient's current pain has improved somewhat with Toradol in the ER.  Could just be groin strain from him working in the yard by pulling trees yesterday.  Encouraged scrotal support and anti-inflammatories at home.  We will give short course of pain medication.  Patient to be given urology follow-up.  Discussed reasons to  return to the ER immediately.  Pt is hemodynamically stable, in NAD, & able to ambulate in the ED. Evaluation does not show pathology that would require ongoing emergent intervention or inpatient treatment. I explained the diagnosis to the patient. Pain has been managed & has no complaints prior to dc. Pt is comfortable with above plan and is stable for discharge at this time. All questions were answered prior to disposition. Strict return precautions for f/u to the ED were discussed. Encouraged follow up with PCP.  Case was dicussed with my attending Dr. Renaye Rakers who is agreeable with the above plan.     Final Clinical Impressions(s) / ED Diagnoses   Final diagnoses:  Groin pain  Dysuria    ED Discharge Orders         Ordered    traMADol (ULTRAM) 50 MG tablet  Every 6 hours PRN     07/31/19 1332           Wallace Keller 07/31/19 1336    Terald Sleeper, MD 07/31/19 1807

## 2019-08-01 LAB — GC/CHLAMYDIA PROBE AMP (~~LOC~~) NOT AT ARMC
Chlamydia: NEGATIVE
Neisseria Gonorrhea: NEGATIVE

## 2020-04-15 DIAGNOSIS — M7551 Bursitis of right shoulder: Secondary | ICD-10-CM | POA: Diagnosis not present

## 2020-04-15 DIAGNOSIS — M25511 Pain in right shoulder: Secondary | ICD-10-CM | POA: Diagnosis not present

## 2020-04-15 DIAGNOSIS — M75101 Unspecified rotator cuff tear or rupture of right shoulder, not specified as traumatic: Secondary | ICD-10-CM | POA: Diagnosis not present

## 2020-04-16 DIAGNOSIS — R0602 Shortness of breath: Secondary | ICD-10-CM | POA: Diagnosis not present

## 2020-04-16 DIAGNOSIS — R5383 Other fatigue: Secondary | ICD-10-CM | POA: Diagnosis not present

## 2021-03-31 ENCOUNTER — Other Ambulatory Visit (HOSPITAL_BASED_OUTPATIENT_CLINIC_OR_DEPARTMENT_OTHER): Payer: Self-pay | Admitting: Family Medicine

## 2021-03-31 DIAGNOSIS — R102 Pelvic and perineal pain: Secondary | ICD-10-CM

## 2021-04-02 ENCOUNTER — Other Ambulatory Visit: Payer: Self-pay

## 2021-04-02 ENCOUNTER — Ambulatory Visit (HOSPITAL_BASED_OUTPATIENT_CLINIC_OR_DEPARTMENT_OTHER)
Admission: RE | Admit: 2021-04-02 | Discharge: 2021-04-02 | Disposition: A | Discharge status: Home / Self Care | Payer: 59 | Source: Ambulatory Visit | Attending: Family Medicine | Admitting: Family Medicine

## 2021-04-02 DIAGNOSIS — R102 Pelvic and perineal pain: Secondary | ICD-10-CM | POA: Insufficient documentation

## 2022-10-09 IMAGING — CT CT ABD-PELV W/O CM
2 of 4 series · 16 of 46 positions shown, 18 images · non-contrast
Comparison: 07/31/2019

CLINICAL DATA: Suprapubic pain for 3 weeks.  Hematuria.

EXAM:
CT ABDOMEN AND PELVIS WITHOUT CONTRAST
TECHNIQUE: Multidetector CT imaging of the abdomen and pelvis was performed
following the standard protocol without IV contrast.

[Series 2: axial st · axial · 0.98mm/px · z∈[-536,-26]mm · 13 of 112 slices shown, 15 images]
[im 5/112  soft-tissue]
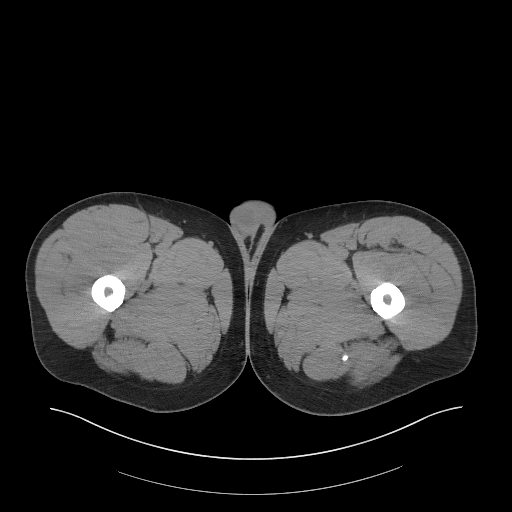
[im 5/112  bone]
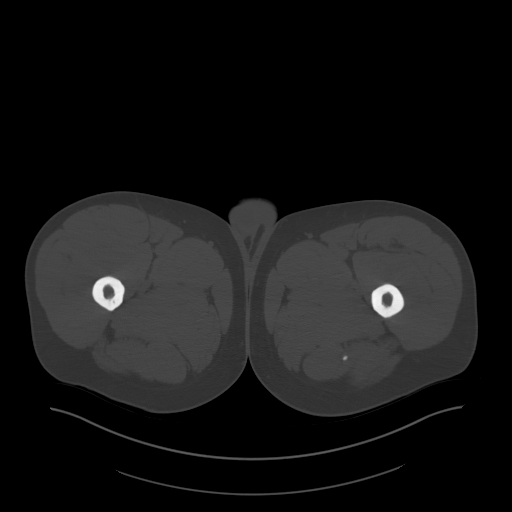
[im 14/112  soft-tissue]
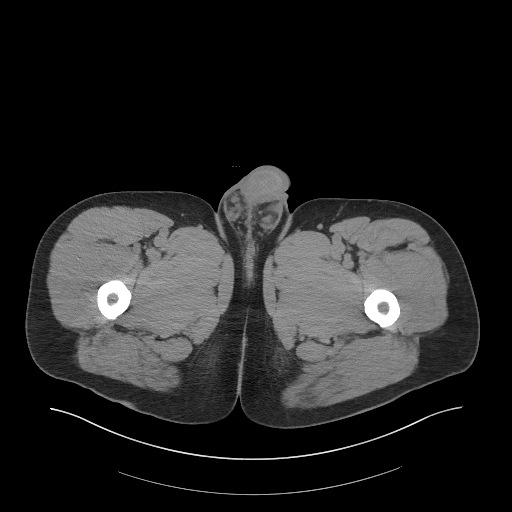
[im 23/112  soft-tissue]
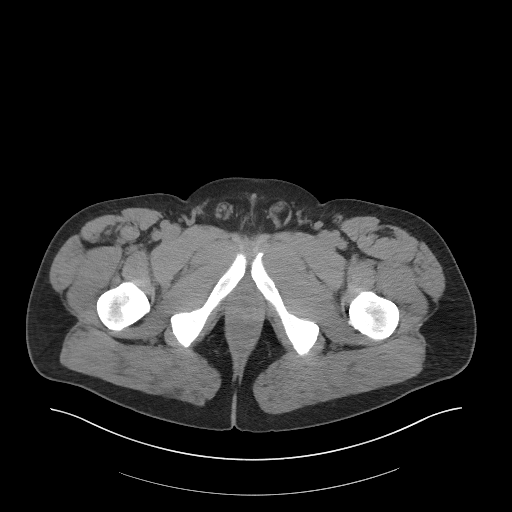
[im 32/112  soft-tissue]
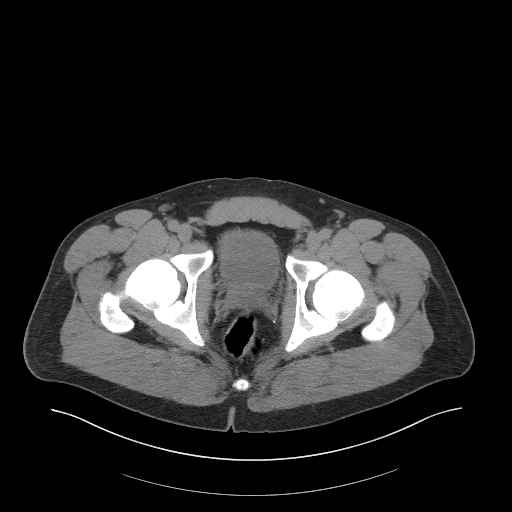
[im 40/112  soft-tissue]
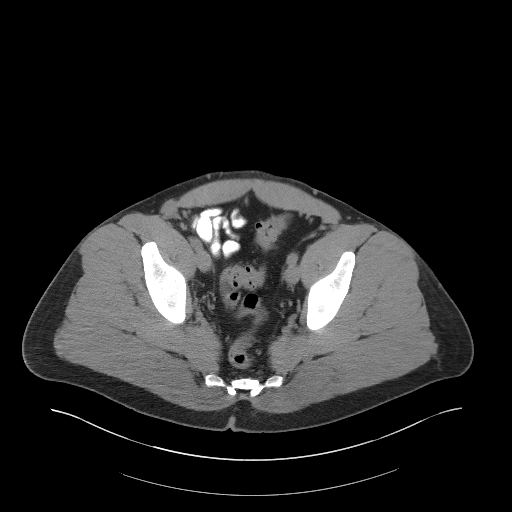
[im 49/112  soft-tissue]
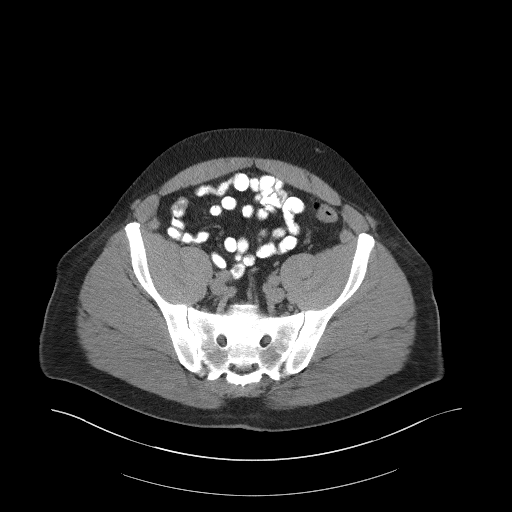
[im 58/112  soft-tissue]
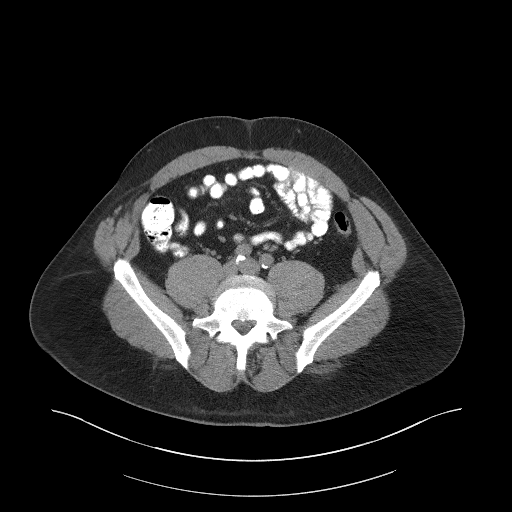
[im 63/112  soft-tissue]
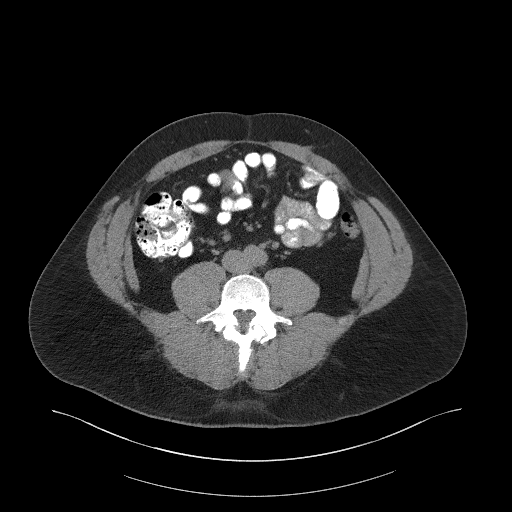
[im 72/112  soft-tissue]
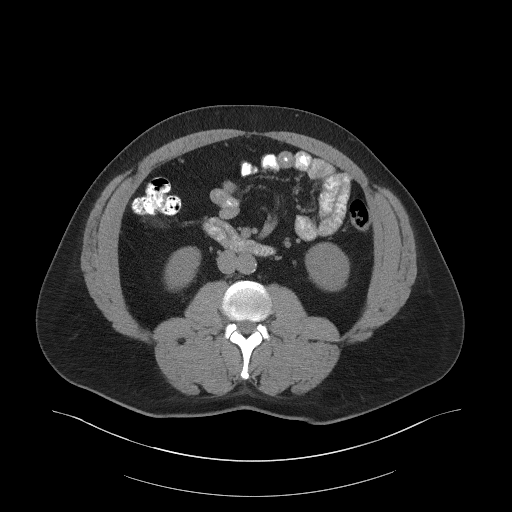
[im 72/112  bone]
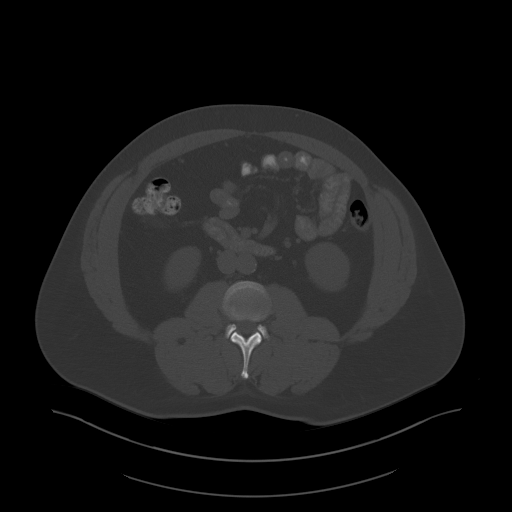
[im 80/112  soft-tissue]
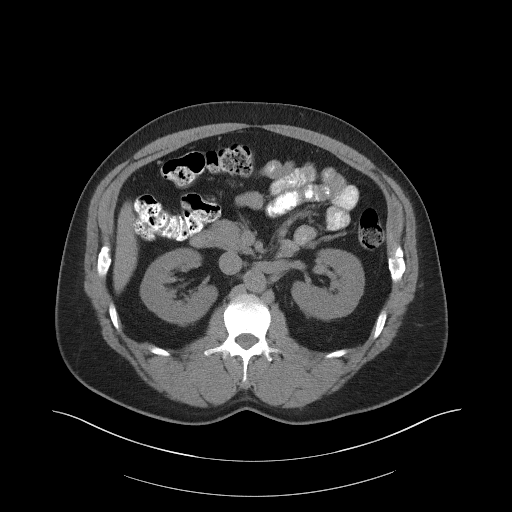
[im 89/112  soft-tissue]
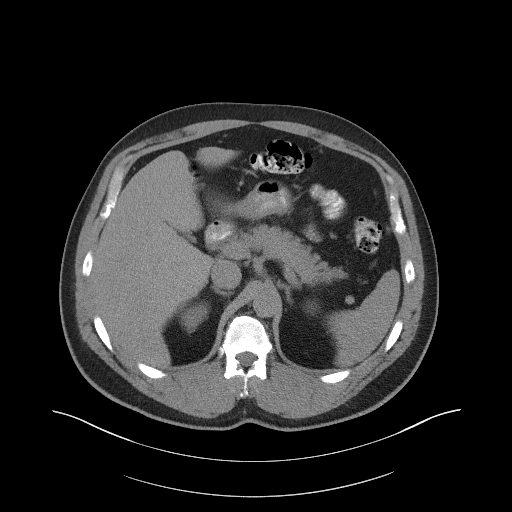
[im 98/112  soft-tissue]
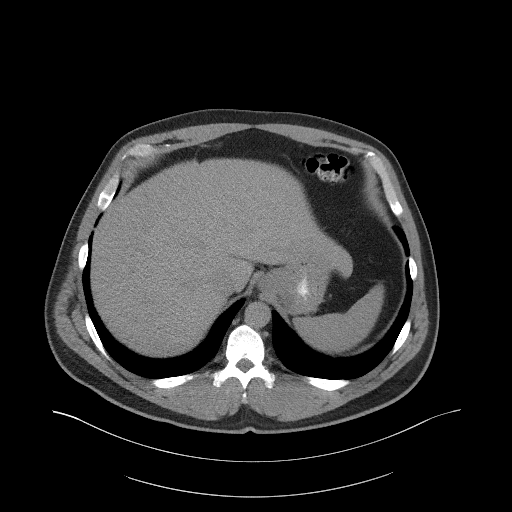
[im 107/112  soft-tissue]
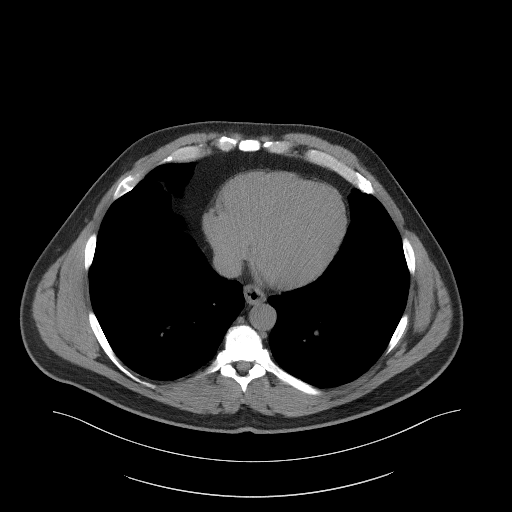

[Series 5: coronal st · coronal · 0.91mm/px · 3 of 109 slices shown]
[im 37/109  soft-tissue]
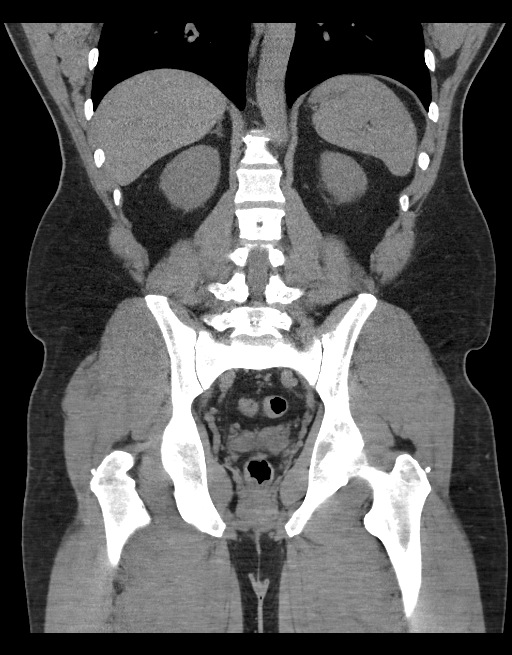
[im 49/109  soft-tissue]
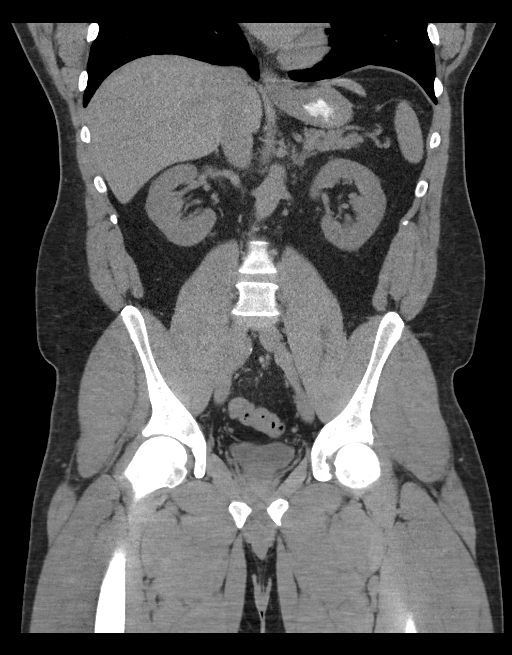
[im 61/109  soft-tissue]
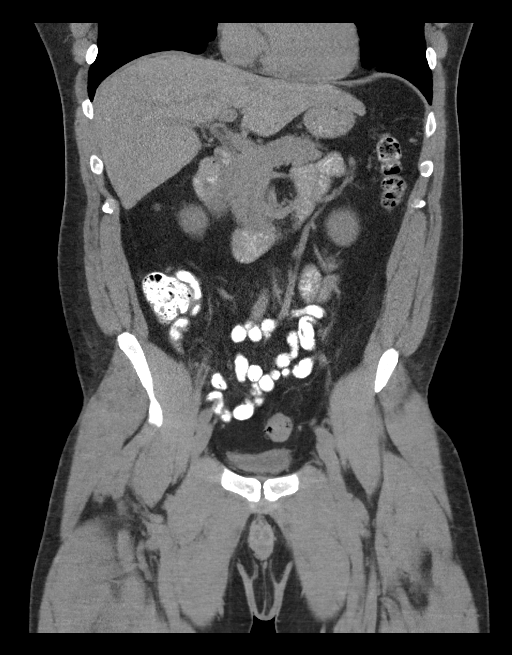

[16 of 46 positions shown; findings below may reference images not displayed]

FINDINGS: Lower chest: No acute findings.

Hepatobiliary: No mass visualized on this unenhanced exam.
Gallbladder is unremarkable. No evidence of biliary ductal
dilatation.

Pancreas: No mass or inflammatory process visualized on this
unenhanced exam.

Spleen:  Within normal limits in size.

Adrenals/Urinary tract: No evidence of urolithiasis or
hydronephrosis. Unremarkable unopacified urinary bladder.

Stomach/Bowel: No evidence of obstruction, inflammatory process, or
abnormal fluid collections.

Vascular/Lymphatic: No pathologically enlarged lymph nodes
identified. No evidence of abdominal aortic aneurysm. Aortic
atherosclerotic calcification noted.

Reproductive:  No mass or other significant abnormality.

Other: None. No evidence of inguinal or ventral abdominal wall
hernia.

Musculoskeletal: No suspicious bone lesions identified. Bilateral L4
pars defects again seen with degenerative disc disease and grade 1
anterolisthesis at L4-5.
IMPRESSION: No evidence of urolithiasis, hydronephrosis, or other acute
findings.

Aortic Atherosclerosis (UT3EH-E2V.V).

## 2022-12-31 DIAGNOSIS — M722 Plantar fascial fibromatosis: Secondary | ICD-10-CM | POA: Diagnosis not present

## 2023-01-01 DIAGNOSIS — M205X1 Other deformities of toe(s) (acquired), right foot: Secondary | ICD-10-CM | POA: Diagnosis not present

## 2023-01-01 DIAGNOSIS — S93691D Other sprain of right foot, subsequent encounter: Secondary | ICD-10-CM | POA: Diagnosis not present

## 2023-01-01 DIAGNOSIS — B070 Plantar wart: Secondary | ICD-10-CM | POA: Diagnosis not present

## 2023-01-01 DIAGNOSIS — M205X2 Other deformities of toe(s) (acquired), left foot: Secondary | ICD-10-CM | POA: Diagnosis not present

## 2023-02-18 DIAGNOSIS — M722 Plantar fascial fibromatosis: Secondary | ICD-10-CM | POA: Diagnosis not present

## 2023-02-18 DIAGNOSIS — R29898 Other symptoms and signs involving the musculoskeletal system: Secondary | ICD-10-CM | POA: Diagnosis not present

## 2023-12-22 DIAGNOSIS — Z131 Encounter for screening for diabetes mellitus: Secondary | ICD-10-CM | POA: Diagnosis not present

## 2023-12-22 DIAGNOSIS — Z13220 Encounter for screening for lipoid disorders: Secondary | ICD-10-CM | POA: Diagnosis not present

## 2023-12-22 DIAGNOSIS — R5382 Chronic fatigue, unspecified: Secondary | ICD-10-CM | POA: Diagnosis not present

## 2023-12-22 DIAGNOSIS — Z0000 Encounter for general adult medical examination without abnormal findings: Secondary | ICD-10-CM | POA: Diagnosis not present

## 2023-12-23 DIAGNOSIS — R6882 Decreased libido: Secondary | ICD-10-CM | POA: Diagnosis not present

## 2023-12-23 DIAGNOSIS — Z0000 Encounter for general adult medical examination without abnormal findings: Secondary | ICD-10-CM | POA: Diagnosis not present

## 2024-02-15 DIAGNOSIS — D128 Benign neoplasm of rectum: Secondary | ICD-10-CM | POA: Diagnosis not present

## 2024-02-15 DIAGNOSIS — Z1211 Encounter for screening for malignant neoplasm of colon: Secondary | ICD-10-CM | POA: Diagnosis not present

## 2024-02-15 DIAGNOSIS — D122 Benign neoplasm of ascending colon: Secondary | ICD-10-CM | POA: Diagnosis not present

## 2024-05-02 DIAGNOSIS — B070 Plantar wart: Secondary | ICD-10-CM | POA: Diagnosis not present

## 2024-05-02 DIAGNOSIS — M216X9 Other acquired deformities of unspecified foot: Secondary | ICD-10-CM | POA: Diagnosis not present

## 2024-05-02 DIAGNOSIS — M205X1 Other deformities of toe(s) (acquired), right foot: Secondary | ICD-10-CM | POA: Diagnosis not present

## 2024-05-18 DIAGNOSIS — B070 Plantar wart: Secondary | ICD-10-CM | POA: Diagnosis not present
